# Patient Record
Sex: Male | Born: 1995 | Race: Black or African American | Hispanic: No | Marital: Single | Smoking: Never smoker
Health system: Southern US, Community
[De-identification: ages and names within clinical notes are randomized; demographics above are authoritative.]

---

## 2020-06-28 ENCOUNTER — Encounter (HOSPITAL_COMMUNITY): Payer: Self-pay | Admitting: Emergency Medicine

## 2020-06-28 ENCOUNTER — Emergency Department (HOSPITAL_COMMUNITY)
Admission: EM | Admit: 2020-06-28 | Discharge: 2020-06-28 | Disposition: A | Discharge status: Home / Self Care | Payer: Medicaid Other | Attending: Emergency Medicine | Admitting: Emergency Medicine

## 2020-06-28 ENCOUNTER — Emergency Department (HOSPITAL_COMMUNITY): Payer: Medicaid Other

## 2020-06-28 DIAGNOSIS — S0591XA Unspecified injury of right eye and orbit, initial encounter: Secondary | ICD-10-CM | POA: Diagnosis present

## 2020-06-28 DIAGNOSIS — X58XXXA Exposure to other specified factors, initial encounter: Secondary | ICD-10-CM | POA: Insufficient documentation

## 2020-06-28 DIAGNOSIS — S01111A Laceration without foreign body of right eyelid and periocular area, initial encounter: Secondary | ICD-10-CM

## 2020-06-28 DIAGNOSIS — S0231XA Fracture of orbital floor, right side, initial encounter for closed fracture: Secondary | ICD-10-CM | POA: Diagnosis not present

## 2020-06-28 MED ORDER — LIDOCAINE HCL 2 % IJ SOLN
20.0000 mL | Freq: Once | INTRAMUSCULAR | Status: AC
  Administered 2020-06-28: 400 mg
  Filled 2020-06-28: qty 20

## 2020-06-28 MED ORDER — TETRACAINE HCL 0.5 % OP SOLN
2.0000 [drp] | Freq: Once | OPHTHALMIC | Status: AC
  Administered 2020-06-28: 2 [drp] via OPHTHALMIC
  Filled 2020-06-28: qty 4

## 2020-06-28 MED ORDER — OXYCODONE HCL 5 MG PO TABS: 5.0000 mg | ORAL_TABLET | ORAL | 0 refills | Status: AC | PRN

## 2020-06-28 MED ORDER — OXYCODONE-ACETAMINOPHEN 5-325 MG PO TABS
1.0000 | ORAL_TABLET | Freq: Once | ORAL | Status: AC
  Administered 2020-06-28: 1 via ORAL
  Filled 2020-06-28: qty 1

## 2020-06-28 MED ORDER — ERYTHROMYCIN 5 MG/GM OP OINT: TOPICAL_OINTMENT | OPHTHALMIC | 0 refills | Status: AC

## 2020-06-28 MED ORDER — FLUORESCEIN SODIUM 1 MG OP STRP
1.0000 | ORAL_STRIP | Freq: Once | OPHTHALMIC | Status: AC
  Administered 2020-06-28: 1 via OPHTHALMIC
  Filled 2020-06-28: qty 1
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
## 2020-06-28 NOTE — Discharge Instructions (Signed)
You may take Tylenol (acetaminophen) up to 1000 mg 4 times a day for 1 week. This is the maximum dose of Tylenol you can take from all sources. Please check other over-the-counter medications and prescriptions to ensure you are not taking other medications that contain acetaminophen.  You may also take ibuprofen 400 mg 6 times a day alternating with or at the same time as tylenol.  Take oxycodone as needed for breakthrough pain.  This medication can be addicting, sedating and cause constipation.
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
## 2020-06-28 NOTE — Consult Note (Signed)
Chief Complaint  Patient presents with  . Eye Injury        Ophthalmology HPI: This is a 25 y.o.  male with a past ocular history listed below that presents with right periorbital edema, swelling, and after running into a wall.  He states his vision is normal. The eye partially swollen and its hard to open it. Denies FBS, diplopia, flashes of light, floaters.      Past Ocular History: None       No past medical history on file.    Social History   Socioeconomic History  . Marital status: Single    Spouse name: Not on file  . Number of children: Not on file  . Years of education: Not on file  . Highest education level: Not on file  Occupational History  . Not on file  Tobacco Use  . Smoking status: Not on file  . Smokeless tobacco: Not on file  Substance and Sexual Activity  . Alcohol use: Not on file  . Drug use: Not on file  . Sexual activity: Not on file  Other Topics Concern  . Not on file  Social History Narrative  . Not on file   Social Determinants of Health   Financial Resource Strain: Not on file  Food Insecurity: Not on file  Transportation Needs: Not on file  Physical Activity: Not on file  Stress: Not on file  Social Connections: Not on file  Intimate Partner Violence: Not on file     No Known Allergies   No current facility-administered medications on file prior to encounter.   No current outpatient medications on file prior to encounter.     Review of Systems  Constitutional: Negative.   HENT: Negative for ear pain and nosebleeds.   Eyes: Positive for blurred vision, pain, discharge and redness. Negative for double vision and photophobia.  Respiratory: Negative.   Cardiovascular: Negative.   Gastrointestinal: Negative.   Genitourinary: Negative.   Musculoskeletal: Negative.   Neurological: Negative.   Endo/Heme/Allergies: Negative.   Psychiatric/Behavioral: Negative.       Exam:  General: Awake, Alert, Oriented  *3  Vision (near): without correction    OD: J10  OS: J1  Confrontational Field:   Full to count fingers, both eyes  Extraocular Motility:  Full ductions and versions, both eyes  [place]ox:   Trace commitant exodeviation without vertical.   External:   Normal Symmetry, decreased sensation infraorbital nerve distribution right sided. Right periorbital ecchymosis. Right lid edema      Pupils  OD: 36mm to 58mm reactive without afferent pupillary defect (APD)  OS: 4.28mm to 33.74mm reactive without afferent pupillary defect (APD)   IOP(tonopen)  OD: 14  OS:16  Palpebral Fissures: 6/9 Levator Function: 10/16 MRD1: 0/4   Slit Lamp Exam:  Lids/Lashes  OD: Upper and lower lid edema. 1.69mm upper lid margin laceration (central lid location). 2 nasal bridge lacerations <0.5cm   OS: Normal lids and lashes, nor lesion or injury  Conjucntiva/Sclera  OD: 180 degress inferior subconjunctival hemorrhage  OS: White and quiet  Cornea  OD: Clear without abrasion or defect  OS: Clear without abrasion or defect  Anterior Chamber  OD: Deep and quiet-  No obvious cell. No hyphema. No hypopion  OS: Deep and quiet  Iris  OD: Normal iris architecture  OS: Normal Iris Architecture   Lens  OD: Clear, Without significant opacities  OS: Clear, Without significant opacities  Anterior Vitreous  OD: Clear, without cell  OS: Clear without cell   POSTERIOR POLE EXAM (Dialated with phenylephrine and tropicamide.Dilation may last up to 24 hours)  View:   OD: 20/40 view without opacities  OS: 20/20 view without opacities  Vitreous:   OD: Clear, no cell  OS: Clear, no cell  Disc:   OD: flat, sharp margin, with appropriate color  OS: flat, sharp margin, with appropriate color  C:D Ratio:   OD: 0.4  OS: 0.4  Macula  OD: Flat, with appropriate light reflex  OS: Flat with appropriate light reflex  Vessels  OD: Normal vasculature  OS: Normal vasculature  Periphery  OD: Flat 360  degrees without tear, hole or detachment  OS: Flat 360 degrees without tear, hole or detachment  CT Face:  IMPRESSION: 1. Mildly depressed right inferior orbital floor fracture with 3 mm osseous displacement. Fracture is comminuted with nondisplaced involvement of the posterior inferior-medial wall. 2. Right periorbital soft tissue edema. No evidence of globe injury.  Assessment and Plan:   This is 25 y.o.  male with right orbital floor and medial wall fractures and right upper eyelid margin laceration.   Orbital Floor Fracture - No signs of enophthalmus or motility disturbance.  - Recommend po antibiotic x 7 days prophylaxis - No nose blowing - Ice 15 minutes every hour x 24 hours for swelling.  - Refer to ENT for further evaluation and management.   Lid Margin Laceration - Recommend suture closure at bedside - See procedure note - Erythromycin Ophthalmic ointment BID x7 days.  - Follow up as outpatient in 1 week.  - Discussed risks, benefits and alternatives to procedure including bleeding, infection, and damage to eye, loss of vision, need for additional surgery. Patient agrees to proceed with repair of Lid margin laceration right side.    Procedure note:  The eye was preped with tetracaine.  The upper eyelid was cleaned with betadine.  A subcutaneous injection of 2% lidocaine was instilled in the upper eyelid approximately 1cc.  A 6-0 vicryl was placed at the grey line long the lid margin.  A 6-0 plain gut suture was placed at the lash line. Good approximation of lid margin was achieved. Erythromycin ointment was instilled onto the incision. The patient tolerated the procedure well.    Mack Hook, M.D.  [facility] [address] [place], [place] [address] [phone] ([phone]

## 2021-02-16 ENCOUNTER — Emergency Department (HOSPITAL_COMMUNITY)
Admission: EM | Admit: 2021-02-16 | Discharge: 2021-02-16 | Disposition: A | Discharge status: Home / Self Care | Payer: Medicaid Other | Attending: Emergency Medicine | Admitting: Emergency Medicine

## 2021-02-16 ENCOUNTER — Emergency Department (HOSPITAL_COMMUNITY): Payer: Medicaid Other

## 2021-02-16 ENCOUNTER — Encounter (HOSPITAL_COMMUNITY): Payer: Self-pay | Admitting: Emergency Medicine

## 2021-02-16 ENCOUNTER — Other Ambulatory Visit: Payer: Self-pay

## 2021-02-16 DIAGNOSIS — S60512A Abrasion of left hand, initial encounter: Secondary | ICD-10-CM | POA: Diagnosis not present

## 2021-02-16 DIAGNOSIS — Y92410 Unspecified street and highway as the place of occurrence of the external cause: Secondary | ICD-10-CM | POA: Diagnosis not present

## 2021-02-16 DIAGNOSIS — T148XXA Other injury of unspecified body region, initial encounter: Secondary | ICD-10-CM

## 2021-02-16 DIAGNOSIS — M79642 Pain in left hand: Secondary | ICD-10-CM | POA: Diagnosis present

## 2021-02-16 MED ORDER — LIDOCAINE-EPINEPHRINE 2 %-1:100000 IJ SOLN
30.0000 mL | Freq: Once | INTRAMUSCULAR | Status: AC
  Administered 2021-02-16: 30 mL via INTRADERMAL
  Filled 2021-02-16: qty 2

## 2021-02-16 MED ORDER — TETANUS-DIPHTH-ACELL PERTUSSIS 5-2.5-18.5 LF-MCG/0.5 IM SUSY
0.5000 mL | PREFILLED_SYRINGE | Freq: Once | INTRAMUSCULAR | Status: DC
  Filled 2021-02-16: qty 0.5

## 2021-02-16 MED ORDER — ETODOLAC 300 MG PO CAPS: 300.0000 mg | ORAL_CAPSULE | Freq: Two times a day (BID) | ORAL | 0 refills | Status: AC

## 2021-02-16 MED ORDER — ACETAMINOPHEN 325 MG PO TABS
650.0000 mg | ORAL_TABLET | Freq: Once | ORAL | Status: AC
  Administered 2021-02-16: 650 mg via ORAL
  Filled 2021-02-16: qty 2
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
## 2021-02-16 NOTE — Discharge Instructions (Addendum)
Please follow up with your primary care provider within 5-7 days for re-evaluation of your symptoms. If you do not have a primary care provider, information for a healthcare clinic has been provided for you to make arrangements for follow up care. Please return to the emergency department for any new or worsening symptoms. ° °
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
## 2024-03-29 ENCOUNTER — Emergency Department (HOSPITAL_COMMUNITY): Admission: EM | Admit: 2024-03-29 | Discharge: 2024-03-29 | Disposition: A | Discharge status: Home / Self Care

## 2024-03-29 ENCOUNTER — Emergency Department (HOSPITAL_COMMUNITY)

## 2024-03-29 ENCOUNTER — Other Ambulatory Visit: Payer: Self-pay

## 2024-03-29 ENCOUNTER — Encounter (HOSPITAL_COMMUNITY): Payer: Self-pay

## 2024-03-29 DIAGNOSIS — Y9367 Activity, basketball: Secondary | ICD-10-CM | POA: Insufficient documentation

## 2024-03-29 DIAGNOSIS — M7989 Other specified soft tissue disorders: Secondary | ICD-10-CM | POA: Insufficient documentation

## 2024-03-29 DIAGNOSIS — M25562 Pain in left knee: Secondary | ICD-10-CM | POA: Insufficient documentation

## 2024-03-29 DIAGNOSIS — X58XXXA Exposure to other specified factors, initial encounter: Secondary | ICD-10-CM | POA: Insufficient documentation

## 2024-03-29 MED ORDER — KETOROLAC TROMETHAMINE 15 MG/ML IJ SOLN
15.0000 mg | Freq: Once | INTRAMUSCULAR | Status: AC
  Administered 2024-03-29: 15 mg via INTRAMUSCULAR
  Filled 2024-03-29: qty 1

## 2024-03-29 NOTE — ED Triage Notes (Signed)
 Pt reports with left knee pain. Pt was playing basketball 3 days ago and injured it. Pt ambulatory but with pain.

## 2024-03-29 NOTE — Discharge Instructions (Signed)
 Today you were seen for left knee pain.  Your x-ray showed a small joint effusion likely caused by a sprain or strain.  You may wear your thank you for letting us  treat you today. After reviewing your imaging, I feel you are safe to go home. Please follow up with your PCP in the next several days and provide them with your records from this visit. Return to the Emergency Room if pain becomes severe or symptoms worsen.

## 2024-03-29 NOTE — ED Provider Notes (Signed)
 "  EMERGENCY DEPARTMENT AT [facility] Provider Note   CSN: [id number] Arrival date & time: 03/29/24  1603     Patient presents with: Knee Pain   Edward Douglas is a 29 y.o. male.  Presents today with left knee pain.  Patient was playing basketball 3 days ago when he injured it.  Patient is able to ambulate but notes pain.  Patient denies numbness, tingling, any other injuries at this time.    Knee Pain      Prior to Admission medications  Medication Sig Start Date End Date Taking? Authorizing Provider  erythromycin  ophthalmic ointment Place a 1/2 inch ribbon of ointment onto your laceration twice a day for one week. 06/28/20   Dreama Longs, MD  oxyCODONE  (ROXICODONE ) 5 MG immediate release tablet Take 1 tablet (5 mg total) by mouth every 4 (four) hours as needed for severe pain. 06/28/20   Dreama Longs, MD    Allergies: Patient has no known allergies.    Review of Systems  Musculoskeletal:  Positive for arthralgias.    Updated Vital Signs BP (!) 158/103   Pulse 82   Temp 98.2 F (36.8 C) (Oral)   Resp 20   SpO2 99%   Physical Exam Vitals and nursing note reviewed.  Constitutional:      General: He is not in acute distress.    Appearance: He is well-developed. He is not toxic-appearing.  HENT:     Head: Normocephalic and atraumatic.     Right Ear: External ear normal.     Left Ear: External ear normal.  Eyes:     Conjunctiva/sclera: Conjunctivae normal.  Cardiovascular:     Rate and Rhythm: Normal rate and regular rhythm.     Pulses: Normal pulses.  Pulmonary:     Effort: Pulmonary effort is normal. No respiratory distress.  Abdominal:     Palpations: Abdomen is soft.     Tenderness: There is no abdominal tenderness.  Musculoskeletal:        General: Swelling present. No deformity. Normal range of motion.     Cervical back: Neck supple.     Right lower leg: No edema.     Left lower leg: No edema.     Comments: Mild  suprapatellar swelling and tenderness to palpation.  There is no joint/ligament laxity or deformity noted on exam.  Patient is neurovascularly intact with +2 dorsalis pedis pulses.  No deformity noted on exam.  Skin:    General: Skin is warm and dry.     Capillary Refill: Capillary refill takes less than 2 seconds.  Neurological:     General: No focal deficit present.     Mental Status: He is alert and oriented to person, place, and time.     Motor: No weakness.  Psychiatric:        Mood and Affect: Mood normal.     (all labs ordered are listed, but only abnormal results are displayed) Labs Reviewed - No data to display  EKG: None  Radiology: DG Knee Complete 4 Views Left Result Date: 03/29/2024 EXAM: 4 OR MORE VIEW(S) XRAY OF THE LEFT KNEE 03/29/2024 05:31:06 PM COMPARISON: None available. CLINICAL HISTORY: Knee pain. FINDINGS: BONES AND JOINTS: No acute fracture. No malalignment. Moderate  suprapatellar knee joint effusion. SOFT TISSUES: Unremarkable. IMPRESSION: 1. Moderate suprapatellar knee joint effusion. Electronically signed by: Elsie Gravely MD 03/29/2024 05:37 PM EST RP Workstation: [id number]     Procedures   Medications Ordered in the ED  ketorolac  (  TORADOL ) 15 MG/ML injection 15 mg (has no administration in time range)                                    Medical Decision Making Amount and/or Complexity of Data Reviewed Radiology: ordered.   This patient presents to the ED for concern of left knee pain differential diagnosis includes fracture, dislocation, strain, sprain, musculoskeletal pain   Imaging Studies ordered:  I ordered imaging studies including left knee x-ray I independently visualized and interpreted imaging which showed moderate suprapatellar knee joint effusion I agree with the radiologist interpretation   Medicines ordered and prescription drug management:  I ordered medication including Toradol     I have reviewed the patients home  medicines and have made adjustments as needed   Problem List / ED Course:  Patient placed in knee brace. Considered for admission or further workup however patient's vital signs, physical exam, and imaging are reassuring.  Patient's symptoms likely due to strain or sprain.  Patient placed in knee brace for support and advised to alternate Tylenol  and Motrin for pain and swelling, ice, rest, and elevate the affected limb.  Patient to follow-up with orthopedics if symptoms persist for further evaluation workup.  I feel patient is safe for discharge at this time.        Final diagnoses:  Acute pain of left knee    ED Discharge Orders     None          Francis Ileana SAILOR, PA-C 03/29/24 1753    Neysa Caron PARAS, DO 03/29/24 2221  "

## 2024-03-29 NOTE — ED Notes (Signed)
Ortho tech called for knee brace.
